# Patient Record
Sex: Female | Born: 1995 | Race: Black or African American | Hispanic: No | Marital: Single | Smoking: Never smoker
Health system: Southern US, Community
[De-identification: ages and names within clinical notes are randomized; demographics above are authoritative.]

## PROBLEM LIST (undated history)

## (undated) HISTORY — PX: WISDOM TOOTH EXTRACTION: SHX21

---
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
## 2016-06-24 ENCOUNTER — Encounter: Payer: Self-pay | Admitting: Family Medicine

## 2016-06-24 ENCOUNTER — Ambulatory Visit (INDEPENDENT_AMBULATORY_CARE_PROVIDER_SITE_OTHER): Payer: 59 | Admitting: Family Medicine

## 2016-06-24 DIAGNOSIS — N920 Excessive and frequent menstruation with regular cycle: Secondary | ICD-10-CM

## 2016-06-24 DIAGNOSIS — N946 Dysmenorrhea, unspecified: Secondary | ICD-10-CM | POA: Diagnosis not present

## 2016-06-24 MED ORDER — NORGESTIM-ETH ESTRAD TRIPHASIC 0.18/0.215/0.25 MG-35 MCG PO TABS: 1.0000 | ORAL_TABLET | Freq: Every day | ORAL | 3 refills | Status: DC

## 2016-06-24 NOTE — Progress Notes (Signed)
   Subjective:    Patient ID: Gail Mccoy is a 21 y.o. female presenting with Medication Management  on 06/24/2016  HPI: Here for contraception renewal. On OC's due to menorrhagia and dysmenorrhea. She reports compliance and no side effects of meds. They are effective. She is not sexually active and has never been. She is a recent transfer to [employer] for elementary education. She is otherwise without complaint.  History reviewed. No pertinent past medical history. Past Surgical History:  Procedure Laterality Date  . WISDOM TOOTH EXTRACTION     Family History  Problem Relation Age of Onset  . Fibromyalgia Mother   . Diabetes Mother    Social History   Social History  . Marital status: Single    Spouse name: N/A  . Number of children: N/A  . Years of education: N/A   Social History Main Topics  . Smoking status: Never Smoker  . Smokeless tobacco: Never Used  . Alcohol use No  . Drug use: No  . Sexual activity: No   Other Topics Concern  . None   Social History Narrative  . None     Review of Systems  Constitutional: Negative for chills and fever.  HENT: Negative for congestion, ear discharge and sore throat.   Eyes: Negative for discharge and redness.  Respiratory: Negative for cough, shortness of breath and wheezing.   Cardiovascular: Negative for leg swelling.  Gastrointestinal: Negative for abdominal pain, constipation, diarrhea, nausea and vomiting.  Genitourinary: Negative for hematuria.  Musculoskeletal: Negative for back pain.  Skin: Negative for rash.      Objective:    BP 102/69 (BP Location: Left Arm, Patient Position: Sitting, Cuff Size: Normal)   Pulse 71   Resp 18   Ht 5\' 4"  (1.626 m)   Wt 178 lb (80.7 kg)   LMP 06/11/2016   BMI 30.55 kg/m  Physical Exam  Constitutional: She is oriented to person, place, and time. She appears well-developed and well-nourished.  HENT:  Head: Normocephalic and atraumatic.  Eyes: Pupils are equal, round,  and reactive to light. No scleral icterus.  Neck: Normal range of motion. No thyromegaly present.  Cardiovascular: Normal rate, regular rhythm and intact distal pulses.   Pulmonary/Chest: Effort normal and breath sounds normal.  Abdominal: Soft. She exhibits no distension. There is no tenderness.  Neurological: She is alert and oriented to person, place, and time.  Skin: Skin is warm and dry.  Psychiatric: She has a normal mood and affect. Her behavior is normal.        Assessment & Plan:   Problem List Items Addressed This Visit      Unprioritized   Menorrhagia    May skip placebo's if desires.      Relevant Medications   Norgestimate-Ethinyl Estradiol Triphasic (TRI-PREVIFEM) 0.18/0.215/0.25 MG-35 MCG tablet   Dysmenorrhea    Continue OC's as directed.      Relevant Medications   Norgestimate-Ethinyl Estradiol Triphasic (TRI-PREVIFEM) 0.18/0.215/0.25 MG-35 MCG tablet     No need for pelvic exam at her age and without any sexual activity. Total face-to-face time with patient: 20 minutes. Over 50% of encounter was spent on counseling and coordination of care. Return in about 1 year (around 06/24/2017).  Reva Boresanya S Yaacov Koziol 06/24/2016 2:29 PM

## 2016-06-24 NOTE — Assessment & Plan Note (Signed)
Continue OC's as directed.

## 2016-06-24 NOTE — Assessment & Plan Note (Signed)
May skip placebo's if desires.

## 2016-06-24 NOTE — Patient Instructions (Signed)
Oral Contraception Use Oral contraceptive pills (OCPs) are medicines taken to prevent pregnancy. OCPs work by preventing the ovaries from releasing eggs. The hormones in OCPs also cause the cervical mucus to thicken, preventing the sperm from entering the uterus. The hormones also cause the uterine lining to become thin, not allowing a fertilized egg to attach to the inside of the uterus. OCPs are highly effective when taken exactly as prescribed. However, OCPs do not prevent sexually transmitted diseases (STDs). Safe sex practices, such as using condoms along with an OCP, can help prevent STDs. Before taking OCPs, you may have a physical exam and Pap test. Your health care provider may also order blood tests if necessary. Your health care provider will make sure you are a good candidate for oral contraception. Discuss with your health care provider the possible side effects of the OCP you may be prescribed. When starting an OCP, it can take 2 to 3 months for the body to adjust to the changes in hormone levels in your body. How to take oral contraceptive pills Your health care provider may advise you on how to start taking the first cycle of OCPs. Otherwise, you can:  Start on day 1 of your menstrual period. You will not need any backup contraceptive protection with this start time.  Start on the first Sunday after your menstrual period or the day you get your prescription. In these cases, you will need to use backup contraceptive protection for the first week.  Start the pill at any time of your cycle. If you take the pill within 5 days of the start of your period, you are protected against pregnancy right away. In this case, you will not need a backup form of birth control. If you start at any other time of your menstrual cycle, you will need to use another form of birth control for 7 days. If your OCP is the type called a minipill, it will protect you from pregnancy after taking it for 2 days (48  hours).  After you have started taking OCPs:  If you forget to take 1 pill, take it as soon as you remember. Take the next pill at the regular time.  If you miss 2 or more pills, call your health care provider because different pills have different instructions for missed doses. Use backup birth control until your next menstrual period starts.  If you use a 28-day pack that contains inactive pills and you miss 1 of the last 7 pills (pills with no hormones), it will not matter. Throw away the rest of the non-hormone pills and start a new pill pack.  No matter which day you start the OCP, you will always start a new pack on that same day of the week. Have an extra pack of OCPs and a backup contraceptive method available in case you miss some pills or lose your OCP pack. Follow these instructions at home:  Do not smoke.  Always use a condom to protect against STDs. OCPs do not protect against STDs.  Use a calendar to mark your menstrual period days.  Read the information and directions that came with your OCP. Talk to your health care provider if you have questions. Contact a health care provider if:  You develop nausea and vomiting.  You have abnormal vaginal discharge or bleeding.  You develop a rash.  You miss your menstrual period.  You are losing your hair.  You need treatment for mood swings or depression.  You   get dizzy when taking the OCP.  You develop acne from taking the OCP.  You become pregnant. Get help right away if:  You develop chest pain.  You develop shortness of breath.  You have an uncontrolled or severe headache.  You develop numbness or slurred speech.  You develop visual problems.  You develop pain, redness, and swelling in the legs. This information is not intended to replace advice given to you by your health care provider. Make sure you discuss any questions you have with your health care provider. Document Released: 04/23/2011 Document  Revised: 10/10/2015 Document Reviewed: 10/23/2012 Elsevier Interactive Patient Education  2017 Elsevier Inc.  

## 2017-05-19 ENCOUNTER — Encounter: Payer: Self-pay | Admitting: Radiology

## 2018-07-15 ENCOUNTER — Ambulatory Visit (HOSPITAL_COMMUNITY)
Admission: EM | Admit: 2018-07-15 | Discharge: 2018-07-15 | Disposition: A | Discharge status: Home / Self Care | Payer: 59 | Attending: Emergency Medicine | Admitting: Emergency Medicine

## 2018-07-15 ENCOUNTER — Other Ambulatory Visit: Payer: Self-pay

## 2018-07-15 ENCOUNTER — Encounter (HOSPITAL_COMMUNITY): Payer: Self-pay | Admitting: *Deleted

## 2018-07-15 DIAGNOSIS — Z113 Encounter for screening for infections with a predominantly sexual mode of transmission: Secondary | ICD-10-CM | POA: Diagnosis not present

## 2018-07-15 DIAGNOSIS — K120 Recurrent oral aphthae: Secondary | ICD-10-CM | POA: Diagnosis not present

## 2018-07-15 NOTE — ED Provider Notes (Signed)
[facility]   [id number] 07/15/18 Arrival Time: 1226   JQ:BHALPFX FOR STD  SUBJECTIVE:  Siona Montag is a 23 y.o. female who presents requesting STI screening.  Currently asymptomatic.  Partner asymptomatic.  Last unprotected sexual encounter 2 weeks ago.  Sexually active with 1 female partner in the past 6 months.  Denies similar symptoms in the past.  Denies fever, chills, nausea, vomiting, abdominal or pelvic pain, urinary symptoms, vaginal itching, vaginal odor, vaginal bleeding, dyspareunia, vaginal rashes or lesions.   Does mentions sore under tongue that occurred around the time of sexual activity.  Denies pain, burning or itching sensation.  Reports previous symptoms with ulcers in her mouth in the past.  Denies aggravating or alleviating factors.    Patient's last menstrual period was 06/24/2018 (exact date).  ROS: As per HPI.  History reviewed. No pertinent past medical history. Past Surgical History:  Procedure Laterality Date  . WISDOM TOOTH EXTRACTION     No Known Allergies No current facility-administered medications on file prior to encounter.    No current outpatient medications on file prior to encounter.   Social History   Socioeconomic History  . Marital status: Single    Spouse name: Not on file  . Number of children: Not on file  . Years of education: Not on file  . Highest education level: Not on file  Occupational History  . Not on file  Social Needs  . Financial resource strain: Not on file  . Food insecurity:    Worry: Not on file    Inability: Not on file  . Transportation needs:    Medical: Not on file    Non-medical: Not on file  Tobacco Use  . Smoking status: Never Smoker  . Smokeless tobacco: Never Used  Substance and Sexual Activity  . Alcohol use: Yes  . Drug use: No  . Sexual activity: Never    Birth control/protection: Abstinence, Pill  Lifestyle  . Physical activity:    Days per week: Not on file    Minutes per  session: Not on file  . Stress: Not on file  Relationships  . Social connections:    Talks on phone: Not on file    Gets together: Not on file    Attends religious service: Not on file    Active member of club or organization: Not on file    Attends meetings of clubs or organizations: Not on file    Relationship status: Not on file  . Intimate partner violence:    Fear of current or ex partner: Not on file    Emotionally abused: Not on file    Physically abused: Not on file    Forced sexual activity: Not on file  Other Topics Concern  . Not on file  Social History Narrative  . Not on file   Family History  Problem Relation Age of Onset  . Fibromyalgia Mother   . Diabetes Mother     OBJECTIVE:  Vitals:   07/15/18 1247  BP: 129/73  Pulse: 88  Resp: 18  Temp: 100 F (37.8 C)  SpO2: 98%     General appearance: alert, NAD, appears stated age Head: NCAT Throat: lips, mucosa, and tongue normal; 1 small white ulcer appreciated underneath tongue without surrounding erythema or bleeding; teeth and gums normal Lungs: CTA bilaterally without adventitious breath sounds Heart: regular rate and rhythm.  Radial pulses 2+ symmetrical bilaterally Back: no CVA tenderness Abdomen: soft, non-tender; bowel sounds normal; no guarding  GU: deferred; vaginal self swab obtained Skin: warm and dry Psychological:  Alert and cooperative. Normal mood and affect.  LABS:  Labs Reviewed  HIV ANTIBODY (ROUTINE TESTING W REFLEX)  RPR  CERVICOVAGINAL ANCILLARY ONLY    ASSESSMENT & PLAN:  1. Screen for STD (sexually transmitted disease)   2. Oral aphthous ulcer    Pending: Labs Reviewed  HIV ANTIBODY (ROUTINE TESTING W REFLEX)  RPR  CERVICOVAGINAL ANCILLARY ONLY    Declines treatment today.  Will wait on results Vaginal self swab obtained  HIV/ syphilis testing today We will follow up with you regarding the results of your test If tests are positive, please abstain from sexual  activity for at least 7 days and notify partners Follow up with PCP if symptoms persists Return here or go to ER if you have any new or worsening symptoms  fever, chills, nausea, vomiting, abdominal or pelvic pain, urinary symptoms, vaginal itching, vaginal odor, vaginal bleeding, dyspareunia, vaginal rashes or lesions, etc...  Use OTC orajel as needed for ulcer.  Follow up with PCP or [facility] if symptoms persists   Reviewed expectations re: course of current medical issues. Questions answered. Outlined signs and symptoms indicating need for more acute intervention. Patient verbalized understanding. After Visit Summary given.       Rennis Harding, PA-C 07/15/18 1337

## 2018-07-15 NOTE — Discharge Instructions (Addendum)
Declines treatment today.  Will wait on results Vaginal self swab obtained  HIV/ syphilis testing today We will follow up with you regarding the results of your test If tests are positive, please abstain from sexual activity for at least 7 days and notify partners Follow up with PCP if symptoms persists Return here or go to ER if you have any new or worsening symptoms  fever, chills, nausea, vomiting, abdominal or pelvic pain, urinary symptoms, vaginal itching, vaginal odor, vaginal bleeding, dyspareunia, vaginal rashes or lesions, etc...  Use OTC orajel as needed for ulcer.  Follow up with PCP or [facility] if symptoms persists

## 2018-07-15 NOTE — ED Triage Notes (Signed)
States her mom and dad both have herepes and she wants to be checked. States she was sexually active for the first time last week and not c/o "bump under her tongue".

## 2018-07-16 LAB — HIV ANTIBODY (ROUTINE TESTING W REFLEX): HIV Screen 4th Generation wRfx: NONREACTIVE

## 2018-07-16 LAB — RPR: RPR Ser Ql: NONREACTIVE

## 2018-07-19 LAB — CERVICOVAGINAL ANCILLARY ONLY
BACTERIAL VAGINITIS: POSITIVE — AB
CHLAMYDIA, DNA PROBE: NEGATIVE
Candida vaginitis: NEGATIVE
NEISSERIA GONORRHEA: NEGATIVE
Trichomonas: NEGATIVE

## 2018-07-20 ENCOUNTER — Telehealth (HOSPITAL_COMMUNITY): Payer: Self-pay | Admitting: Emergency Medicine

## 2018-07-20 MED ORDER — METRONIDAZOLE 500 MG PO TABS: 500.0000 mg | ORAL_TABLET | Freq: Two times a day (BID) | ORAL | 0 refills | Status: AC

## 2018-07-20 NOTE — Telephone Encounter (Signed)
Bacterial vaginosis is positive. This was not treated at the urgent care visit.  Flagyl 500 mg BID x 7 days #14 no refills sent to patients pharmacy of choice.  Pt verbalized understanding 

## 2018-08-22 ENCOUNTER — Ambulatory Visit
Admission: EM | Admit: 2018-08-22 | Discharge: 2018-08-22 | Disposition: A | Discharge status: Home / Self Care | Payer: 59 | Attending: Family Medicine | Admitting: Family Medicine

## 2018-08-22 ENCOUNTER — Other Ambulatory Visit: Payer: Self-pay

## 2018-08-22 ENCOUNTER — Encounter: Payer: Self-pay | Admitting: Emergency Medicine

## 2018-08-22 DIAGNOSIS — J029 Acute pharyngitis, unspecified: Secondary | ICD-10-CM | POA: Diagnosis not present

## 2018-08-22 LAB — RAPID STREP SCREEN (MED CTR MEBANE ONLY): Streptococcus, Group A Screen (Direct): NEGATIVE

## 2018-08-22 MED ORDER — LIDOCAINE VISCOUS HCL 2 % MT SOLN: OROMUCOSAL | 0 refills | Status: AC

## 2018-08-22 NOTE — ED Triage Notes (Signed)
Patient c/o sore throat that started 1 week ago. She states she thought it was allergies but it's not going away. She is still able to eat and drink without difficulty.

## 2018-08-22 NOTE — ED Provider Notes (Signed)
[facility]    CSN: [id number] Arrival date & time: 08/22/18  8676     History   Chief Complaint Chief Complaint  Patient presents with  . Sore Throat    HPI Gail Mccoy is a 23 y.o. female.    Sore Throat  This is a new problem. The current episode started more than 2 days ago. The problem occurs constantly. The problem has not changed since onset.Pertinent negatives include no chest pain, no abdominal pain, no headaches and no shortness of breath. She has tried nothing for the symptoms.    History reviewed. No pertinent past medical history.  Patient Active Problem List   Diagnosis Date Noted  . Menorrhagia 06/24/2016  . Dysmenorrhea 06/24/2016    Past Surgical History:  Procedure Laterality Date  . WISDOM TOOTH EXTRACTION      OB History    Gravida  0   Para  0   Term  0   Preterm  0   AB  0   Living  0     SAB  0   TAB  0   Ectopic  0   Multiple  0   Live Births  0            Home Medications    Prior to Admission medications   Medication Sig Start Date End Date Taking? Authorizing Provider  lidocaine (XYLOCAINE) 2 % solution 20 ml gargle and spit q 6 hours prn sore throat 08/22/18   Payton Mccallum, MD  metroNIDAZOLE (FLAGYL) 500 MG tablet Take 1 tablet (500 mg total) by mouth 2 (two) times daily. 07/20/18   Eustace Moore, MD    Family History Family History  Problem Relation Age of Onset  . Fibromyalgia Mother   . Diabetes Mother     Social History Social History   Tobacco Use  . Smoking status: Never Smoker  . Smokeless tobacco: Never Used  Substance Use Topics  . Alcohol use: Yes  . Drug use: No     Allergies   Patient has no known allergies.   Review of Systems Review of Systems  Respiratory: Negative for shortness of breath.   Cardiovascular: Negative for chest pain.  Gastrointestinal: Negative for abdominal pain.  Neurological: Negative for headaches.     Physical Exam Triage Vital  Signs ED Triage Vitals  Enc Vitals Group     BP 08/22/18 1006 126/82     Pulse Rate 08/22/18 1006 67     Resp 08/22/18 1006 18     Temp 08/22/18 1006 98.2 F (36.8 C)     Temp Source 08/22/18 1006 Oral     SpO2 08/22/18 1006 100 %     Weight 08/22/18 1008 197 lb (89.4 kg)     Height 08/22/18 1008 5' 4.5" (1.638 m)     Head Circumference --      Peak Flow --      Pain Score 08/22/18 1007 3     Pain Loc --      Pain Edu? --      Excl. in GC? --    No data found.  Updated Vital Signs BP 126/82 (BP Location: Left Arm)   Pulse 67   Temp 98.2 F (36.8 C) (Oral)   Resp 18   Ht 5' 4.5" (1.638 m)   Wt 89.4 kg   LMP 08/22/2018   SpO2 100%   BMI 33.29 kg/m   Visual Acuity Right Eye Distance:   Left  Eye Distance:   Bilateral Distance:    Right Eye Near:   Left Eye Near:    Bilateral Near:     Physical Exam Constitutional:      General: She is not in acute distress.    Appearance: She is not toxic-appearing or diaphoretic.  HENT:     Mouth/Throat:     Pharynx: No oropharyngeal exudate or posterior oropharyngeal erythema.  Pulmonary:     Effort: Pulmonary effort is normal. No respiratory distress.  Neurological:     Mental Status: She is alert.      UC Treatments / Results  Labs (all labs ordered are listed, but only abnormal results are displayed) Labs Reviewed  RAPID STREP SCREEN ([facility] ONLY)  CULTURE, GROUP A STREP [facility])    EKG None  Radiology No results found.  Procedures Procedures (including critical care time)  Medications Ordered in UC Medications - No data to display  Initial Impression / Assessment and Plan / UC Course  I have reviewed the triage vital signs and the nursing notes.  Pertinent labs & imaging results that were available during my care of the patient were reviewed by me and considered in my medical decision making (see chart for details).      Final Clinical Impressions(s) / UC Diagnoses   Final diagnoses:   Sore throat     Discharge Instructions     Over the counter allergy medication for the possibility of allergy related Recommendations per [place] DHHS guidelines    ED Prescriptions    Medication Sig Dispense Auth. Provider   lidocaine (XYLOCAINE) 2 % solution 20 ml gargle and spit q 6 hours prn sore throat 100 mL Payton Mccallum, MD      1. Lab results and diagnosis reviewed with patient 2. rx as per orders above; reviewed possible side effects, interactions, risks and benefits  3. Recommend supportive treatment as above 4. Follow-up prn if symptoms worsen or don't improve Controlled Substance Prescriptions [place] Controlled Substance Registry consulted? Not Applicable   Payton Mccallum, MD 08/22/18 [phone]

## 2018-08-22 NOTE — Discharge Instructions (Addendum)
Over the counter allergy medication for the possibility of allergy related Recommendations per [facility] DHHS guidelines

## 2018-08-25 LAB — CULTURE, GROUP A STREP (THRC)

## 2018-09-26 ENCOUNTER — Other Ambulatory Visit: Payer: Self-pay

## 2018-09-26 ENCOUNTER — Encounter: Payer: Self-pay | Admitting: *Deleted

## 2018-09-26 ENCOUNTER — Emergency Department: Payer: 59

## 2018-09-26 ENCOUNTER — Emergency Department
Admission: EM | Admit: 2018-09-26 | Discharge: 2018-09-26 | Disposition: A | Discharge status: Home / Self Care | Payer: 59 | Attending: Emergency Medicine | Admitting: Emergency Medicine

## 2018-09-26 DIAGNOSIS — Y93I9 Activity, other involving external motion: Secondary | ICD-10-CM | POA: Insufficient documentation

## 2018-09-26 DIAGNOSIS — Y92410 Unspecified street and highway as the place of occurrence of the external cause: Secondary | ICD-10-CM | POA: Diagnosis not present

## 2018-09-26 DIAGNOSIS — Y998 Other external cause status: Secondary | ICD-10-CM | POA: Diagnosis not present

## 2018-09-26 DIAGNOSIS — S199XXA Unspecified injury of neck, initial encounter: Secondary | ICD-10-CM | POA: Insufficient documentation

## 2018-09-26 MED ORDER — MELOXICAM 15 MG PO TABS: 15.0000 mg | ORAL_TABLET | Freq: Every day | ORAL | 1 refills | Status: AC

## 2018-09-26 MED ORDER — KETOROLAC TROMETHAMINE 30 MG/ML IJ SOLN
30.0000 mg | Freq: Once | INTRAMUSCULAR | Status: AC
  Administered 2018-09-26: 30 mg via INTRAMUSCULAR
  Filled 2018-09-26: qty 1

## 2018-09-26 MED ORDER — METHOCARBAMOL 500 MG PO TABS: 500.0000 mg | ORAL_TABLET | Freq: Three times a day (TID) | ORAL | 0 refills | Status: AC | PRN

## 2018-09-26 MED ORDER — METHOCARBAMOL 500 MG PO TABS
1000.0000 mg | ORAL_TABLET | Freq: Once | ORAL | Status: AC
  Administered 2018-09-26: 22:00:00 1000 mg via ORAL
  Filled 2018-09-26: qty 2
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
## 2019-03-27 ENCOUNTER — Ambulatory Visit: Payer: 59 | Admitting: Adult Health

## 2019-06-19 ENCOUNTER — Other Ambulatory Visit: Payer: Self-pay
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
